# Patient Record
Sex: Female | Born: 1955
Health system: Southern US, Community
[De-identification: ages and names within clinical notes are randomized; demographics above are authoritative.]

## PROBLEM LIST (undated history)

## (undated) DIAGNOSIS — R200 Anesthesia of skin: Secondary | ICD-10-CM

## (undated) DIAGNOSIS — R29810 Facial weakness: Secondary | ICD-10-CM

## (undated) DIAGNOSIS — G2581 Restless legs syndrome: Secondary | ICD-10-CM

## (undated) DIAGNOSIS — G4733 Obstructive sleep apnea (adult) (pediatric): Secondary | ICD-10-CM

## (undated) DIAGNOSIS — Z9989 Dependence on other enabling machines and devices: Secondary | ICD-10-CM

## (undated) DIAGNOSIS — F419 Anxiety disorder, unspecified: Secondary | ICD-10-CM

## (undated) HISTORY — DX: Obstructive sleep apnea (adult) (pediatric): G47.33

## (undated) HISTORY — DX: Restless legs syndrome: G25.81

## (undated) HISTORY — DX: Facial weakness: R29.810

## (undated) HISTORY — DX: Anesthesia of skin: R20.0

## (undated) HISTORY — DX: Anxiety disorder, unspecified: F41.9

## (undated) HISTORY — DX: Dependence on other enabling machines and devices: Z99.89

---

## 2006-09-21 ENCOUNTER — Other Ambulatory Visit: Admission: RE | Admit: 2006-09-21 | Discharge: 2006-09-21 | Payer: Self-pay | Admitting: Family Medicine

## 2008-04-26 ENCOUNTER — Encounter: Admission: RE | Admit: 2008-04-26 | Discharge: 2008-04-26 | Payer: Self-pay | Admitting: Family Medicine

## 2010-08-30 ENCOUNTER — Encounter: Admission: RE | Admit: 2010-08-30 | Discharge: 2010-08-30 | Payer: Self-pay | Admitting: *Deleted

## 2011-07-30 ENCOUNTER — Other Ambulatory Visit: Payer: Self-pay | Admitting: *Deleted

## 2011-07-30 ENCOUNTER — Other Ambulatory Visit: Payer: Self-pay | Admitting: Family Medicine

## 2011-07-30 DIAGNOSIS — Z1231 Encounter for screening mammogram for malignant neoplasm of breast: Secondary | ICD-10-CM

## 2011-09-01 ENCOUNTER — Ambulatory Visit
Admission: RE | Admit: 2011-09-01 | Discharge: 2011-09-01 | Disposition: A | Discharge status: Home / Self Care | Payer: Self-pay | Source: Ambulatory Visit | Attending: *Deleted | Admitting: *Deleted

## 2011-09-01 DIAGNOSIS — Z1231 Encounter for screening mammogram for malignant neoplasm of breast: Secondary | ICD-10-CM

## 2011-09-02 ENCOUNTER — Other Ambulatory Visit (HOSPITAL_COMMUNITY)
Admission: RE | Admit: 2011-09-02 | Discharge: 2011-09-02 | Disposition: A | Discharge status: Home / Self Care | Payer: BC Managed Care – PPO | Source: Ambulatory Visit | Attending: Family Medicine | Admitting: Family Medicine

## 2011-09-02 ENCOUNTER — Other Ambulatory Visit: Payer: Self-pay

## 2011-09-02 ENCOUNTER — Other Ambulatory Visit: Payer: Self-pay | Admitting: Family Medicine

## 2011-09-02 DIAGNOSIS — Z01419 Encounter for gynecological examination (general) (routine) without abnormal findings: Secondary | ICD-10-CM | POA: Insufficient documentation

## 2011-09-02 DIAGNOSIS — N644 Mastodynia: Secondary | ICD-10-CM

## 2011-09-17 ENCOUNTER — Ambulatory Visit
Admission: RE | Admit: 2011-09-17 | Discharge: 2011-09-17 | Disposition: A | Discharge status: Home / Self Care | Payer: BC Managed Care – PPO | Source: Ambulatory Visit | Attending: Family Medicine | Admitting: Family Medicine

## 2011-09-17 ENCOUNTER — Other Ambulatory Visit: Payer: Self-pay | Admitting: Family Medicine

## 2011-09-17 DIAGNOSIS — N644 Mastodynia: Secondary | ICD-10-CM

## 2012-09-01 ENCOUNTER — Other Ambulatory Visit: Payer: Self-pay | Admitting: Family Medicine

## 2012-09-01 ENCOUNTER — Other Ambulatory Visit (HOSPITAL_COMMUNITY)
Admission: RE | Admit: 2012-09-01 | Discharge: 2012-09-01 | Disposition: A | Discharge status: Home / Self Care | Payer: BC Managed Care – PPO | Source: Ambulatory Visit | Attending: Family Medicine | Admitting: Family Medicine

## 2012-09-01 DIAGNOSIS — Z0000 Encounter for general adult medical examination without abnormal findings: Secondary | ICD-10-CM | POA: Insufficient documentation

## 2012-10-14 ENCOUNTER — Other Ambulatory Visit (HOSPITAL_COMMUNITY): Payer: BC Managed Care – PPO

## 2012-11-16 ENCOUNTER — Encounter: Payer: Self-pay | Admitting: Diagnostic Neuroimaging

## 2012-11-16 DIAGNOSIS — G459 Transient cerebral ischemic attack, unspecified: Secondary | ICD-10-CM

## 2012-12-16 ENCOUNTER — Other Ambulatory Visit: Payer: Self-pay | Admitting: Diagnostic Neuroimaging

## 2012-12-16 DIAGNOSIS — R209 Unspecified disturbances of skin sensation: Secondary | ICD-10-CM

## 2012-12-16 DIAGNOSIS — G459 Transient cerebral ischemic attack, unspecified: Secondary | ICD-10-CM

## 2013-09-15 ENCOUNTER — Other Ambulatory Visit: Payer: Self-pay | Admitting: Family Medicine

## 2013-09-15 DIAGNOSIS — Z1231 Encounter for screening mammogram for malignant neoplasm of breast: Secondary | ICD-10-CM

## 2013-10-27 ENCOUNTER — Ambulatory Visit
Admission: RE | Admit: 2013-10-27 | Discharge: 2013-10-27 | Disposition: A | Discharge status: Home / Self Care | Payer: BC Managed Care – PPO | Source: Ambulatory Visit | Attending: Family Medicine | Admitting: Family Medicine

## 2013-10-27 DIAGNOSIS — Z1231 Encounter for screening mammogram for malignant neoplasm of breast: Secondary | ICD-10-CM

## 2014-09-25 ENCOUNTER — Other Ambulatory Visit: Payer: Self-pay | Admitting: Family Medicine

## 2014-09-25 DIAGNOSIS — Z1231 Encounter for screening mammogram for malignant neoplasm of breast: Secondary | ICD-10-CM

## 2015-09-25 ENCOUNTER — Other Ambulatory Visit: Payer: Self-pay | Admitting: Family Medicine

## 2015-09-25 ENCOUNTER — Other Ambulatory Visit (HOSPITAL_COMMUNITY)
Admission: RE | Admit: 2015-09-25 | Discharge: 2015-09-25 | Disposition: A | Discharge status: Home / Self Care | Payer: BLUE CROSS/BLUE SHIELD | Source: Ambulatory Visit | Attending: Family Medicine | Admitting: Family Medicine

## 2015-09-25 DIAGNOSIS — Z01419 Encounter for gynecological examination (general) (routine) without abnormal findings: Secondary | ICD-10-CM | POA: Insufficient documentation

## 2015-09-26 LAB — CYTOLOGY - PAP

## 2016-06-02 DIAGNOSIS — N390 Urinary tract infection, site not specified: Secondary | ICD-10-CM | POA: Diagnosis not present

## 2016-06-02 DIAGNOSIS — R300 Dysuria: Secondary | ICD-10-CM | POA: Diagnosis not present

## 2016-06-02 DIAGNOSIS — M545 Low back pain: Secondary | ICD-10-CM | POA: Diagnosis not present

## 2016-09-26 DIAGNOSIS — E784 Other hyperlipidemia: Secondary | ICD-10-CM | POA: Diagnosis not present

## 2016-09-26 DIAGNOSIS — R7301 Impaired fasting glucose: Secondary | ICD-10-CM | POA: Diagnosis not present

## 2016-09-26 DIAGNOSIS — Z23 Encounter for immunization: Secondary | ICD-10-CM | POA: Diagnosis not present

## 2016-09-26 DIAGNOSIS — Z0001 Encounter for general adult medical examination with abnormal findings: Secondary | ICD-10-CM | POA: Diagnosis not present

## 2016-10-24 DIAGNOSIS — R300 Dysuria: Secondary | ICD-10-CM | POA: Diagnosis not present

## 2016-11-18 DIAGNOSIS — N390 Urinary tract infection, site not specified: Secondary | ICD-10-CM | POA: Diagnosis not present

## 2017-06-16 DIAGNOSIS — Z23 Encounter for immunization: Secondary | ICD-10-CM | POA: Diagnosis not present

## 2017-06-16 DIAGNOSIS — R7303 Prediabetes: Secondary | ICD-10-CM | POA: Diagnosis not present

## 2017-06-16 DIAGNOSIS — G4730 Sleep apnea, unspecified: Secondary | ICD-10-CM | POA: Diagnosis not present

## 2017-06-16 DIAGNOSIS — E784 Other hyperlipidemia: Secondary | ICD-10-CM | POA: Diagnosis not present

## 2017-06-16 DIAGNOSIS — R5383 Other fatigue: Secondary | ICD-10-CM | POA: Diagnosis not present

## 2018-04-29 ENCOUNTER — Other Ambulatory Visit (HOSPITAL_COMMUNITY)
Admission: RE | Admit: 2018-04-29 | Discharge: 2018-04-29 | Disposition: A | Discharge status: Home / Self Care | Payer: BLUE CROSS/BLUE SHIELD | Source: Ambulatory Visit | Attending: Family Medicine | Admitting: Family Medicine

## 2018-04-29 ENCOUNTER — Other Ambulatory Visit: Payer: Self-pay | Admitting: Family Medicine

## 2018-04-29 DIAGNOSIS — E785 Hyperlipidemia, unspecified: Secondary | ICD-10-CM | POA: Diagnosis not present

## 2018-04-29 DIAGNOSIS — R7303 Prediabetes: Secondary | ICD-10-CM | POA: Diagnosis not present

## 2018-04-29 DIAGNOSIS — Z01411 Encounter for gynecological examination (general) (routine) with abnormal findings: Secondary | ICD-10-CM | POA: Diagnosis not present

## 2018-04-29 DIAGNOSIS — Z0000 Encounter for general adult medical examination without abnormal findings: Secondary | ICD-10-CM | POA: Diagnosis not present

## 2018-04-29 DIAGNOSIS — Z124 Encounter for screening for malignant neoplasm of cervix: Secondary | ICD-10-CM | POA: Diagnosis not present

## 2018-04-30 LAB — CYTOLOGY - PAP
Diagnosis: NEGATIVE
HPV: NOT DETECTED

## 2018-05-03 DIAGNOSIS — Z1231 Encounter for screening mammogram for malignant neoplasm of breast: Secondary | ICD-10-CM | POA: Diagnosis not present

## 2018-08-17 DIAGNOSIS — G4700 Insomnia, unspecified: Secondary | ICD-10-CM | POA: Diagnosis not present

## 2018-08-17 DIAGNOSIS — J209 Acute bronchitis, unspecified: Secondary | ICD-10-CM | POA: Diagnosis not present

## 2018-09-04 DIAGNOSIS — R030 Elevated blood-pressure reading, without diagnosis of hypertension: Secondary | ICD-10-CM | POA: Diagnosis not present

## 2018-09-04 DIAGNOSIS — J209 Acute bronchitis, unspecified: Secondary | ICD-10-CM | POA: Diagnosis not present

## 2018-09-04 DIAGNOSIS — N390 Urinary tract infection, site not specified: Secondary | ICD-10-CM | POA: Diagnosis not present

## 2018-09-11 DIAGNOSIS — J209 Acute bronchitis, unspecified: Secondary | ICD-10-CM | POA: Diagnosis not present

## 2018-09-11 DIAGNOSIS — G4730 Sleep apnea, unspecified: Secondary | ICD-10-CM | POA: Diagnosis not present

## 2018-09-13 DIAGNOSIS — G4730 Sleep apnea, unspecified: Secondary | ICD-10-CM | POA: Diagnosis not present

## 2018-09-13 DIAGNOSIS — J45901 Unspecified asthma with (acute) exacerbation: Secondary | ICD-10-CM | POA: Diagnosis not present

## 2018-09-13 DIAGNOSIS — G4700 Insomnia, unspecified: Secondary | ICD-10-CM | POA: Diagnosis not present

## 2018-11-26 DIAGNOSIS — J069 Acute upper respiratory infection, unspecified: Secondary | ICD-10-CM | POA: Diagnosis not present

## 2018-12-06 DIAGNOSIS — G4733 Obstructive sleep apnea (adult) (pediatric): Secondary | ICD-10-CM | POA: Diagnosis not present

## 2018-12-23 ENCOUNTER — Ambulatory Visit
Admission: RE | Admit: 2018-12-23 | Discharge: 2018-12-23 | Disposition: A | Discharge status: Home / Self Care | Payer: BLUE CROSS/BLUE SHIELD | Source: Ambulatory Visit | Attending: Family Medicine | Admitting: Family Medicine

## 2018-12-23 ENCOUNTER — Other Ambulatory Visit: Payer: Self-pay | Admitting: Family Medicine

## 2018-12-23 ENCOUNTER — Other Ambulatory Visit: Payer: Self-pay

## 2018-12-23 DIAGNOSIS — R05 Cough: Secondary | ICD-10-CM | POA: Diagnosis not present

## 2018-12-23 DIAGNOSIS — R059 Cough, unspecified: Secondary | ICD-10-CM

## 2019-02-03 DIAGNOSIS — G4733 Obstructive sleep apnea (adult) (pediatric): Secondary | ICD-10-CM | POA: Diagnosis not present

## 2019-02-11 DIAGNOSIS — J988 Other specified respiratory disorders: Secondary | ICD-10-CM | POA: Diagnosis not present

## 2019-02-18 DIAGNOSIS — R05 Cough: Secondary | ICD-10-CM | POA: Diagnosis not present

## 2019-02-22 ENCOUNTER — Other Ambulatory Visit: Payer: Self-pay | Admitting: Family Medicine

## 2019-02-22 ENCOUNTER — Ambulatory Visit
Admission: RE | Admit: 2019-02-22 | Discharge: 2019-02-22 | Disposition: A | Discharge status: Home / Self Care | Payer: BLUE CROSS/BLUE SHIELD | Source: Ambulatory Visit | Attending: Family Medicine | Admitting: Family Medicine

## 2019-02-22 DIAGNOSIS — R059 Cough, unspecified: Secondary | ICD-10-CM

## 2019-02-22 DIAGNOSIS — R05 Cough: Secondary | ICD-10-CM

## 2019-05-04 DIAGNOSIS — Z0000 Encounter for general adult medical examination without abnormal findings: Secondary | ICD-10-CM | POA: Diagnosis not present

## 2019-05-04 DIAGNOSIS — R300 Dysuria: Secondary | ICD-10-CM | POA: Diagnosis not present

## 2019-05-04 DIAGNOSIS — R5383 Other fatigue: Secondary | ICD-10-CM | POA: Diagnosis not present

## 2019-05-04 DIAGNOSIS — Z8249 Family history of ischemic heart disease and other diseases of the circulatory system: Secondary | ICD-10-CM | POA: Diagnosis not present

## 2019-05-04 DIAGNOSIS — R7303 Prediabetes: Secondary | ICD-10-CM | POA: Diagnosis not present

## 2019-05-04 DIAGNOSIS — E785 Hyperlipidemia, unspecified: Secondary | ICD-10-CM | POA: Diagnosis not present

## 2019-06-10 DIAGNOSIS — K219 Gastro-esophageal reflux disease without esophagitis: Secondary | ICD-10-CM | POA: Diagnosis not present

## 2019-08-17 DIAGNOSIS — Z1159 Encounter for screening for other viral diseases: Secondary | ICD-10-CM | POA: Diagnosis not present

## 2019-08-22 DIAGNOSIS — K2100 Gastro-esophageal reflux disease with esophagitis, without bleeding: Secondary | ICD-10-CM | POA: Diagnosis not present

## 2019-08-22 DIAGNOSIS — Z1211 Encounter for screening for malignant neoplasm of colon: Secondary | ICD-10-CM | POA: Diagnosis not present

## 2019-08-22 DIAGNOSIS — K224 Dyskinesia of esophagus: Secondary | ICD-10-CM | POA: Diagnosis not present

## 2019-08-22 DIAGNOSIS — D120 Benign neoplasm of cecum: Secondary | ICD-10-CM | POA: Diagnosis not present

## 2019-08-22 DIAGNOSIS — K2970 Gastritis, unspecified, without bleeding: Secondary | ICD-10-CM | POA: Diagnosis not present

## 2019-08-22 DIAGNOSIS — K219 Gastro-esophageal reflux disease without esophagitis: Secondary | ICD-10-CM | POA: Diagnosis not present

## 2019-08-22 DIAGNOSIS — R12 Heartburn: Secondary | ICD-10-CM | POA: Diagnosis not present

## 2019-08-22 DIAGNOSIS — K2940 Chronic atrophic gastritis without bleeding: Secondary | ICD-10-CM | POA: Diagnosis not present

## 2019-11-21 IMAGING — DX CHEST - 2 VIEW
2 series · 2 of 2 positions shown · non-contrast
Comparison: No prior.

CLINICAL DATA: Productive cough.

EXAM:
CHEST - 2 VIEW

[dg chest 2 view (1 of 2)]
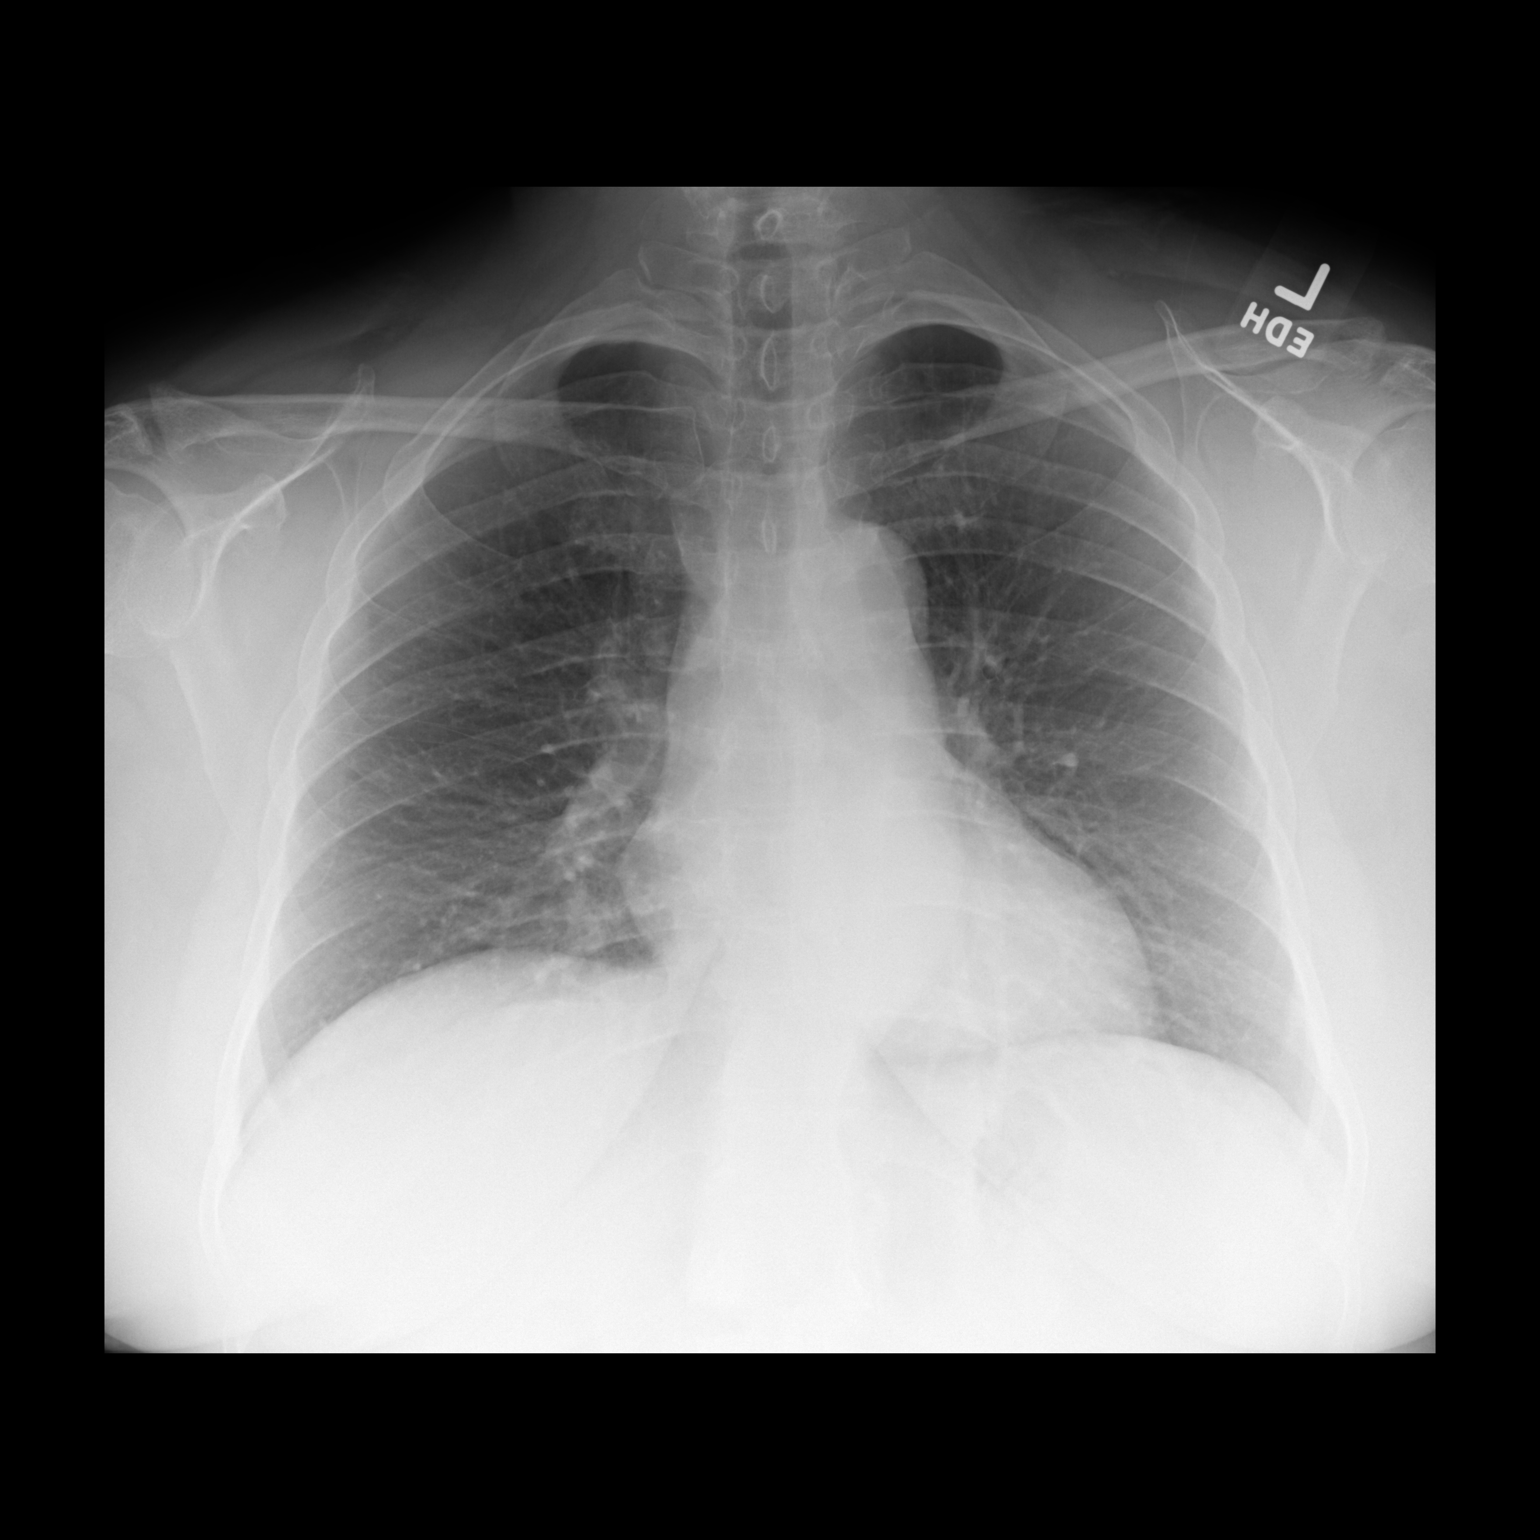

[dg chest 2 view (2 of 2)]
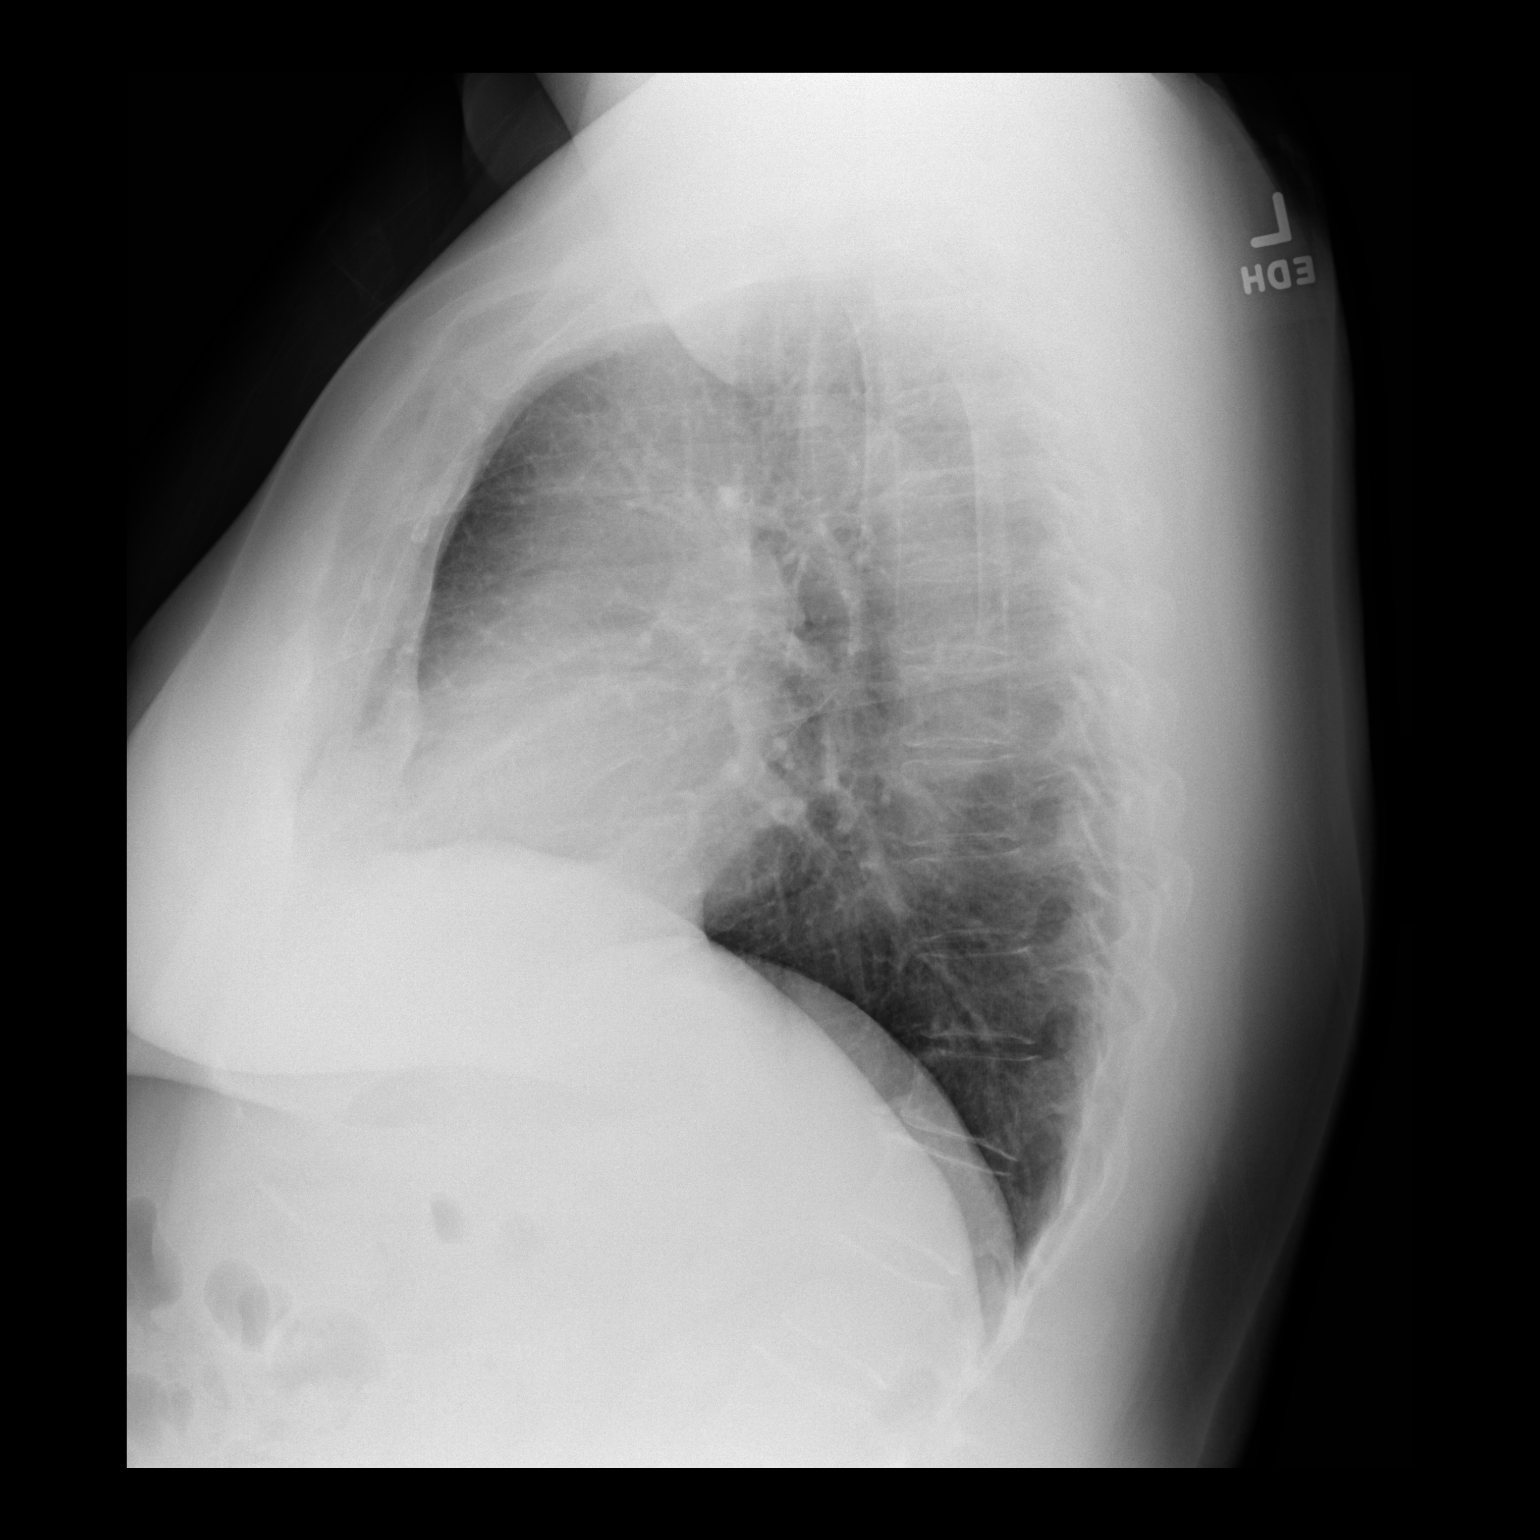

[2 of 2 positions shown; findings below may reference images not displayed]

FINDINGS: Mediastinum hilar structures normal. Cardiomegaly with normal
pulmonary vascularity. Mild right base infiltrate. No pleural
effusion or pneumothorax.
IMPRESSION: 1.  Mild right base infiltrate.

2.  Mild cardiomegaly.  No pulmonary venous congestion.

## 2020-06-27 ENCOUNTER — Other Ambulatory Visit: Payer: Self-pay | Admitting: Orthopedic Surgery

## 2020-06-27 DIAGNOSIS — R2232 Localized swelling, mass and lump, left upper limb: Secondary | ICD-10-CM

## 2020-07-12 ENCOUNTER — Other Ambulatory Visit: Payer: Self-pay | Admitting: Family Medicine

## 2020-07-12 DIAGNOSIS — Z1231 Encounter for screening mammogram for malignant neoplasm of breast: Secondary | ICD-10-CM

## 2020-07-18 ENCOUNTER — Ambulatory Visit
Admission: RE | Admit: 2020-07-18 | Discharge: 2020-07-18 | Disposition: A | Discharge status: Home / Self Care | Payer: BLUE CROSS/BLUE SHIELD | Source: Ambulatory Visit | Attending: Orthopedic Surgery | Admitting: Orthopedic Surgery

## 2020-07-18 DIAGNOSIS — R2232 Localized swelling, mass and lump, left upper limb: Secondary | ICD-10-CM

## 2020-08-20 ENCOUNTER — Other Ambulatory Visit: Payer: Self-pay

## 2020-08-20 ENCOUNTER — Ambulatory Visit
Admission: RE | Admit: 2020-08-20 | Discharge: 2020-08-20 | Disposition: A | Discharge status: Home / Self Care | Payer: BLUE CROSS/BLUE SHIELD | Source: Ambulatory Visit | Attending: Family Medicine | Admitting: Family Medicine

## 2020-08-20 DIAGNOSIS — Z1231 Encounter for screening mammogram for malignant neoplasm of breast: Secondary | ICD-10-CM

## 2020-08-24 ENCOUNTER — Other Ambulatory Visit: Payer: Self-pay | Admitting: Family Medicine

## 2020-08-24 DIAGNOSIS — R928 Other abnormal and inconclusive findings on diagnostic imaging of breast: Secondary | ICD-10-CM

## 2020-09-10 ENCOUNTER — Ambulatory Visit
Admission: RE | Admit: 2020-09-10 | Discharge: 2020-09-10 | Disposition: A | Discharge status: Home / Self Care | Payer: Managed Care, Other (non HMO) | Source: Ambulatory Visit | Attending: Family Medicine | Admitting: Family Medicine

## 2020-09-10 ENCOUNTER — Other Ambulatory Visit: Payer: Self-pay

## 2020-09-10 ENCOUNTER — Ambulatory Visit: Payer: Managed Care, Other (non HMO)

## 2020-09-10 DIAGNOSIS — R928 Other abnormal and inconclusive findings on diagnostic imaging of breast: Secondary | ICD-10-CM

## 2021-12-24 DIAGNOSIS — R350 Frequency of micturition: Secondary | ICD-10-CM | POA: Diagnosis not present

## 2021-12-30 DIAGNOSIS — R310 Gross hematuria: Secondary | ICD-10-CM | POA: Diagnosis not present

## 2022-01-06 ENCOUNTER — Ambulatory Visit: Payer: BC Managed Care – PPO | Attending: Family Medicine

## 2022-01-06 DIAGNOSIS — R2681 Unsteadiness on feet: Secondary | ICD-10-CM

## 2022-01-06 DIAGNOSIS — R42 Dizziness and giddiness: Secondary | ICD-10-CM

## 2022-01-06 DIAGNOSIS — H8111 Benign paroxysmal vertigo, right ear: Secondary | ICD-10-CM | POA: Diagnosis not present

## 2022-01-06 NOTE — Therapy (Signed)
?OUTPATIENT PHYSICAL THERAPY VESTIBULAR EVALUATION ? ? ? ? ?Patient Name: Monique Romero ?MRN: [id number] ?DOB:April 01, 1956, 66 y.o., female ?Today's Date: 01/06/2022 ? ?PCP: London Pepper, MD ?REFERRING PROVIDER: Lujean Amel, MD ? ? PT End of Session - 01/06/22 0719   ? ? Visit Number 1   ? Number of Visits 5   ? Date for PT Re-Evaluation 02/07/22   ? Authorization Type BCBS   ? PT Start Time [phone]   ? PT Stop Time 0800   ? PT Time Calculation (min) 42 min   ? Activity Tolerance Patient tolerated treatment well   ? Behavior During Therapy [place] for tasks assessed/performed   ? ?  ?  ? ?  ? ? ?Past Medical History:  ?Diagnosis Date  ? Anxiety   ? Facial weakness   ? OSA (obstructive sleep apnea)   ? OSA on CPAP   ? Right facial numbness   ? RLS (restless legs syndrome)   ? ?History reviewed. No pertinent surgical history. ?Patient Active Problem List  ? Diagnosis Date Noted  ? TIA (transient ischemic attack) 11/16/2012  ? ? ?ONSET DATE:12/30/2021 (Referral Date)  ? ?REFERRING DIAG: R42 (ICD-10-CM) - Dizziness and giddiness ? ?THERAPY DIAG:  ?Dizziness and giddiness ? ?BPPV (benign paroxysmal positional vertigo), right ? ?Unsteadiness on feet ? ?SUBJECTIVE:  ? ?SUBJECTIVE STATEMENT: ?Patient reports that first noticed the dizziness approx 4 months. Reports it comes and goes. Patient reports when she lays down on her back and on the right side she has a spinning sensation. Patient reports that she has also had some nausea associated. Also has instances where she feels like she is going to faint. Patient denies hearing changes, husband feels like it has decreased. Also reports some tinnitus in the R ear > L ear. No falls due to the dizziness, but does report she feels unsteady. Especially reports some lightheadedness in the morning.  ?Pt accompanied by: significant other ? ?PERTINENT HISTORY: Anxiety, Facial Weaknesss, OSA, Restless Leg ? ? ?PAIN:  ?Are you having pain? No ? ?PRECAUTIONS: None ? ?WEIGHT BEARING RESTRICTIONS  No ? ?FALLS: Has patient fallen in last 6 months? No ? ?LIVING ENVIRONMENT: ?Lives with: lives with their family ?Lives in: House/apartment ?Has following equipment at home: None ? 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  ? ?PATIENT GOALS Improve the Dizziness ? ?OBJECTIVE:  ? ?COGNITION: ?Overall cognitive status: Within functional limits for tasks assessed ?  ?SENSATION: ?WFL ? ?POSTURE: No Significant postural limitations ? ?STRENGTH: WFL ? ?TRANSFERS: ?Assistive device utilized: None  ?Sit to stand: Complete Independence ?Stand to sit: Complete Independence ? ?GAIT: ?Gait pattern: WFL ?Distance walked: clinic distance ?Assistive device utilized: None ?Level of assistance: Complete Independence ?Comments: mild unsteadiness after ambulation ? ? ?PATIENT SURVEYS:  ?FOTO DPS: 55.3 and DFS: 55 ? ? ?VESTIBULAR ASSESSMENT ?  ? SYMPTOM BEHAVIOR: ?  Subjective history: see subjective ?  Non-Vestibular symptoms: tinnitus ?  Type of dizziness: Imbalance (Disequilibrium), Spinning/Vertigo, Unsteady with head/body turns, and Lightheadedness/Faint ?  Frequency: once in a while ?  Duration: seconds to minutes ?  Aggravating factors: Induced by position change: lying supine and rolling to the right and Worse in the morning ?  Relieving factors: closing eyes and rest ?  Progression of symptoms: better ? ? OCULOMOTOR EXAM: ?  Ocular Alignment: normal ?  Ocular ROM: No Limitations ?  Spontaneous Nystagmus: absent ?  Gaze-Induced Nystagmus: absent ?  Smooth Pursuits: intact ?  Saccades: intact ?  ? ?  VESTIBULAR - OCULAR REFLEX:  ?  Slow VOR: Normal ?  VOR Cancellation: Normal ?  Head-Impulse Test: HIT Right: positive ?HIT Left: negative ?  Dynamic Visual Acuity: Static: 11 ?Dynamic: 8 ?  ? POSITIONAL TESTING: Right Dix-Hallpike: upbeating, right nystagmus; Duration:20 seconds ?Left Dix-Hallpike: none; Duration: 0 ?Right Roll Test: none; Duration: 0 ?Left Roll Test: none; Duration: 0 ?  ? ?MOTION  SENSITIVITY: ? ?  Motion Sensitivity Quotient ? ?Intensity: 0 = none, 1 = Lightheaded, 2 = Mild, 3 = Moderate, 4 = Severe, 5 = Vomiting ? Intensity  ?1. Sitting to supine 0  ?2. Supine to L side 0  ?3. Supine to R side 0  ?4. Supine to sitting 2  ?5. L Hallpike-Dix 0  ?6. Up from L  1  ?7. R Hallpike-Dix 2  ?8. Up from R  2  ?9. Sitting, head  ?tipped to L knee   ?10. Head up from L  ?knee   ?11. Sitting, head  ?tipped to R knee   ?12. Head up from R  ?knee   ?13. Sitting head turns x5   ?14.Sitting head nods x5   ?15. In stance, 180?  ?turn to L    ?16. In stance, 180?  ?turn to R   ? ? ? ? ?VESTIBULAR TREATMENT: ? ?Canalith Repositioning: ?  Epley Right: Number of Reps: 1 and Response to Treatment: symptoms improved ? ? ?PATIENT EDUCATION: ?Education details: Educated on POC/evaluation Findings ?Person educated: Patient and Spouse ?Education method: Explanation ?Education comprehension: verbalized understanding ? ? ?GOALS: ?Goals reviewed with patient? Yes ? ?SHORT TERM GOALS = LONG TERM GOALS ? ? ?LONG TERM GOALS: Target date: 02/07/2022 ? ?Pt will be independent with initial HEP for improved vestibular/balance  ?Baseline: No HEP established ?Goal status: INITIAL ? ?2.  Patient will demonstrate (-) positional testing to indicate resolution of BPPV ?Baseline: R BPPV ?Goal status: INITIAL ? ?3.  Pt will improve DVA to </= 2 line difference indicating improved VOR ?Baseline: 3 line difference ?Goal status: INITIAL ? ?4.  Patient will verbalize understanding of self treatment for BPPV in case of future reoccurence ?Baseline: dependent ?Goal status: INITIAL ? ?ASSESSMENT: ? ?CLINICAL IMPRESSION: ?Patient is a 66 y.o. female referred to Neuro OPPT services for Dizziness/Vertigo. Patient's PMH significant for the following: Anxiety, Facial Weaknesss, OSA, Restless Leg . Upon evaluation, patient presents with the following impairments: dizziness, positive HIT on R, R Upbeating Nystagmus of short duration with R Dix  Hallpike indicating R Posterior Canal Canalithiasis. Completed CRM x 1 rep with improvements noted. Patient will benefit from skilled PT services to address impairments.  ? ? ? ?OBJECTIVE IMPAIRMENTS decreased activity tolerance, decreased balance, and dizziness.  ? ?ACTIVITY LIMITATIONS driving and occupation.  ? ?PERSONAL FACTORS Time since onset of injury/illness/exacerbation and 3+ comorbidities: Anxiety, Facial Weaknesss, OSA, Restless Leg  are also affecting patient's functional outcome.  ? ? ?REHAB POTENTIAL: Excellent ? ?CLINICAL DECISION MAKING: Stable/uncomplicated ? ?EVALUATION COMPLEXITY: Low ? ? ?PLAN: ?PT FREQUENCY: 1x/week ? ?PT DURATION: 4 weeks ? ?PLANNED INTERVENTIONS: Therapeutic exercises, Therapeutic activity, Neuromuscular re-education, Balance training, Gait training, Patient/Family education, Joint mobilization, Stair training, Vestibular training, and Canalith repositioning ? ?PLAN FOR NEXT SESSION: Reassess R BPPV and treat as indicated. Initiate VOR HEP. At D/C complete FOTO. ? ? ?Jones Bales, PT, DPT ?01/06/2022, 9:05 AM  ?

## 2022-01-13 ENCOUNTER — Ambulatory Visit: Payer: BC Managed Care – PPO | Admitting: Physical Therapy

## 2022-01-13 ENCOUNTER — Encounter: Payer: Self-pay | Admitting: Physical Therapy

## 2022-01-13 DIAGNOSIS — R2681 Unsteadiness on feet: Secondary | ICD-10-CM | POA: Diagnosis not present

## 2022-01-13 DIAGNOSIS — R42 Dizziness and giddiness: Secondary | ICD-10-CM | POA: Diagnosis not present

## 2022-01-13 DIAGNOSIS — H8111 Benign paroxysmal vertigo, right ear: Secondary | ICD-10-CM

## 2022-01-13 NOTE — Patient Instructions (Addendum)
Gaze Stabilization: Sitting ? ? ? ?Keeping eyes on target on a plain wall a few feet away, gently tilt head down 15-30? and move head side to side for __30__ seconds. Repeat while moving head up and down for __30__ seconds. ? ?Perform each 3 times.  ?Do _2___ sessions per day. ? ? ? ?Manteniendo los ojos en el objetivo en una pared lisa a unos pocos pies de distancia, incline suavemente la cabeza hacia abajo de 15 a 30? y mu?vala de lado a lado durante __30__ segundos. Repita mientras mueve la cabeza hacia arriba y [place] abajo durante __30__ segundos. ? ?Realice cada 3 veces. ?Haz _2___ sesiones por d?a. ?Copyright ? VHI. All rights reserved.  ? ?

## 2022-01-13 NOTE — Therapy (Addendum)
?OUTPATIENT PHYSICAL THERAPY VESTIBULAR TREATMENT NOTE ? ? ?Patient Name: Monique Romero ?MRN: [id number] ?DOB:Apr 10, 1956, 66 y.o., female ?Today's Date: 01/13/2022 ? ?PCP: Farris Has, MD ?REFERRING PROVIDER: Farris Has, MD ? ? PT End of Session - 01/13/22 0850   ? ? Visit Number 2   ? Number of Visits 5   ? Date for PT Re-Evaluation 02/07/22   ? Authorization Type BCBS   ? PT Start Time 0848   ? PT Stop Time 0926   ? PT Time Calculation (min) 38 min   ? Activity Tolerance Patient tolerated treatment well   ? Behavior During Therapy [facility] for tasks assessed/performed   ? ?  ?  ? ?  ? ? ?Past Medical History:  ?Diagnosis Date  ? Anxiety   ? Facial weakness   ? OSA (obstructive sleep apnea)   ? OSA on CPAP   ? Right facial numbness   ? RLS (restless legs syndrome)   ? ?History reviewed. No pertinent surgical history. ?Patient Active Problem List  ? Diagnosis Date Noted  ? TIA (transient ischemic attack) 11/16/2012  ? ? ?ONSET DATE: 12/30/2021 (Referral Date)  ? ?REFERRING DIAG: R42 (ICD-10-CM) - Dizziness and giddiness ? ?THERAPY DIAG:  ?Dizziness and giddiness ? ?BPPV (benign paroxysmal positional vertigo), right ? ?Unsteadiness on feet ? ?PERTINENT HISTORY: Anxiety, Facial Weaknesss, OSA, Restless Leg ? ?PRECAUTIONS: None ? ?SUBJECTIVE: Still having the dizziness, sometimes when looking up.  ? ?Pt's husband helps with spanish translation.  ? ?PAIN:  ?Are you having pain? No ? ? ? ?OBJECTIVE:  ? ?POSITIONAL TESTS: ? ?Right Dix-Hallpike: upbeating, right nystagmus; Duration:10-15 seconds   ? ?Re-assessed after 1 rep of Epley, pt with no dizziness and no nystagmus.  ? ?VESTIBULAR TREATMENT: ? ?Canalith Repositioning: ?Epley Right: Number of Reps: 1, Response to Treatment: symptoms improved. ?   ?Gaze Adaptation: ?x1 Viewing Horizontal: Position: Seated with feet on floor, Time: 30 sec, Reps: 4, and Comment: Cues for gentle chin tuck and proper ROM, mild dizziness  ? ? ?x1 Viewing Vertical:  Position: Seated with feet  on floor, Time: 30 sec , Reps: 2, and Comment: no dizziness  ? ? ? ? ?PATIENT EDUCATION: ?Education details: Educated on findings of R Gilberto Better and resolution of R BPPV after 1 rep of Epley maneuver, educated to have pt try to sleep on her L side for a couple nights, Initiated VOR HEP and purpose of performing these exercises in regards to evaluation findings.  ?Person educated: Patient and Spouse ?Education method: Explanation ?Education comprehension: verbalized understanding ? ? ?HEP: ?Seated VOR x1 (see pt instructions) ?  ?  ?GOALS: ?Goals reviewed with patient? Yes ?  ?SHORT TERM GOALS = LONG TERM GOALS ?  ?  ?LONG TERM GOALS: Target date: 02/07/2022 ?  ?Pt will be independent with initial HEP for improved vestibular/balance  ?Baseline: No HEP established ?Goal status: INITIAL ?  ?2.  Patient will demonstrate (-) positional testing to indicate resolution of BPPV ?Baseline: R BPPV ?Goal status: INITIAL ?  ?3.  Pt will improve DVA to </= 2 line difference indicating improved VOR ?Baseline: 3 line difference ?Goal status: INITIAL ?  ?4.  Patient will verbalize understanding of self treatment for BPPV in case of future reoccurence ?Baseline: dependent ?Goal status: INITIAL ?  ?ASSESSMENT: ?  ?CLINICAL IMPRESSION: ?Pt demonstrated continued  R Posterior Canal Canalithiasis with assessment with R [employer]. Treated with Epley x1 rep with pt reporting improved symptoms. When re-assessing, pt with no dizziness and no  nystagmus in R [employer]- indicating resolution of R posterior canal BPPV. Remainder of session focused on initiating HEP for VOR x1 in sitting. Pt with mild dizziness with horizontal head movements, but was able to tolerate well.  Will continue per POC.  ? ?  ?  ?  ?OBJECTIVE IMPAIRMENTS decreased activity tolerance, decreased balance, and dizziness.  ?  ?ACTIVITY LIMITATIONS driving and occupation.  ?  ?PERSONAL FACTORS Time since onset of injury/illness/exacerbation and 3+ comorbidities:  Anxiety, Facial Weaknesss, OSA, Restless Leg  are also affecting patient's functional outcome.  ?  ?  ?REHAB POTENTIAL: Excellent ?  ?CLINICAL DECISION MAKING: Stable/uncomplicated ?  ?EVALUATION COMPLEXITY: Low ?  ?  ?PLAN: ?PT FREQUENCY: 1x/week ?  ?PT DURATION: 4 weeks ?  ?PLANNED INTERVENTIONS: Therapeutic exercises, Therapeutic activity, Neuromuscular re-education, Balance training, Gait training, Patient/Family education, Joint mobilization, Stair training, Vestibular training, and Canalith repositioning ?  ?PLAN FOR NEXT SESSION: Reassess R BPPV and treat as indicated. Pt demonstrated resolution during today's session, but just in case. Educate on self Epley maneuver to perform at home if it does return. Review VOR HEP and progress as appropriate. At D/C complete FOTO. ?  ? ? ?Drake Leach, PT, DPT  ?01/13/2022, 9:31 AM ? ? ?   ? ?

## 2022-01-20 ENCOUNTER — Encounter: Payer: Self-pay | Admitting: Physical Therapy

## 2022-01-20 ENCOUNTER — Ambulatory Visit: Payer: BC Managed Care – PPO | Admitting: Physical Therapy

## 2022-01-20 DIAGNOSIS — R2681 Unsteadiness on feet: Secondary | ICD-10-CM | POA: Diagnosis not present

## 2022-01-20 DIAGNOSIS — R42 Dizziness and giddiness: Secondary | ICD-10-CM

## 2022-01-20 DIAGNOSIS — H8111 Benign paroxysmal vertigo, right ear: Secondary | ICD-10-CM

## 2022-01-20 NOTE — Patient Instructions (Signed)
?V?rtigo posicional benigno ?Benign Positional Vertigo ?El v?rtigo es la sensaci?n de que usted o todo lo que lo rodea se mueve cuando en realidad eso no sucede. El v?rtigo posicional benigno es el tipo de v?rtigo m?s com?n. Generalmente se trata de una enfermedad no da?ina (benigna). Esta afecci?n es posicional. Esto significa que los s?ntomas son desencadenados por ciertos movimientos y posiciones. ?Esta afecci?n puede ser peligrosa si ocurre mientras est? haciendo algo que podr?a suponer un riesgo para usted o para los dem?s. Incluye actividades como conducir u operar [place]. ??Cu?les son las causas? ?El o?do interno tiene canales llenos de l?quido que ayudan a que el cerebro perciba el movimiento y el equilibrio. Cuando el l?quido se mueve, el cerebro recibe mensajes sobre la posici?n del cuerpo. ?En el v?rtigo posicional benigno, los cristales de calcio que est?n en el o?do interno se desprenden y alteran la zona del o?do interno. Esto hace que el cerebro reciba mensajes confusos sobre la posici?n del cuerpo. ??Qu? incrementa el riesgo? ?Es m?s probable que sufra esta afecci?n si: ?Es mujer. ?Es mayor de 50 a?os. ?Ha sufrido una lesi?n en la cabeza recientemente. ?Tiene una enfermedad en el o?do interno. ??Cu?les son los signos o s?ntomas? ?Generalmente, los s?ntomas de este trastorno se presentan al mover la cabeza o los ojos en diferentes direcciones. Los s?ntomas pueden aparecer repentinamente y suelen durar menos de un minuto. Incluyen los siguientes: ?P?rdida del equilibrio y ca?das. ?Sensaci?n de estar dando vueltas o movi?ndose. ?Sensaci?n de que el entorno est? dando vueltas o movi?ndose. ?N?useas y v?mitos. ?Visi?n borrosa. ?Mareos. ?Movimientos oculares involuntarios (nistagmo). ?Los s?ntomas pueden ser leves y solo causar problemas menores, o pueden ser graves e interferir en la vida cotidiana. Los episodios de v?rtigo posicional benigno pueden repetirse (volver a Arts administrator) con el transcurso del  tiempo. Los s?ntomas tambi?n pueden mejorar con [employer]. ??C?mo se diagnostica? ?Esta afecci?n se puede diagnosticar en funci?n de lo siguiente: ?Sus antecedentes m?dicos. ?Un examen f?sico de la cabeza, el cuello y los o?dos. ?Pruebas de posici?n para detectar o estimular el v?rtigo. Es posible que le pidan que gire la cabeza y [place] de posici?n, como pasar de estar sentado a Acupuncturist. El m?dico estar? pendiente por si aparecen s?ntomas de v?rtigo. ?Tal vez lo deriven a un m?dico especialista en problemas de la garganta, la nariz y el o?do (otorrinolaring?logo), o a uno que se especializa en trastornos del sistema nervioso (neur?logo). ??C?mo se trata? ? ?Esta afecci?n se puede tratar en una sesi?n en la cual el m?dico le pondr? la cabeza en posiciones espec?ficas para ayudar a que los cristales desplazados del o?do interno se muevan. El tratamiento de esta afecci?n puede llevar varias sesiones. Cuando los casos son graves, tal vez haya que realizar una cirug?a, pero esto no es frecuente. ?En algunos casos, el v?rtigo posicional benigno se resuelve por s? solo en el t?rmino de 2 o 4 semanas. ?Siga estas instrucciones en su casa: ?Seguridad ?Mu?vase lentamente. Evite algunas posiciones o determinados movimientos repentinos de la cabeza y el cuerpo como se lo haya indicado el m?dico. ?Evite conducir y Secretary/administrator que el m?dico le indique que es seguro [place]. ?No haga ninguna tarea que podr?a ser peligrosa para usted o para Producer, television/film/video en caso de v?rtigo. ?Si tiene dificultad para caminar o mantener el equilibrio, use un bast?n para Consulting civil engineer estabilidad. Si se siente mareado o inestable, si?ntese de inmediato. ?Retome sus actividades normales seg?n lo indicado por el m?dico. Preg?ntele al m?dico qu? actividades son  seguras para usted. ?Instrucciones generales ?Use los medicamentos de venta libre y los recetados solamente como se lo haya indicado el m?dico. ?Beba suficiente l?quido como para  mantener la orina de color [place] p?lido. ?Concurra a todas las visitas de seguimiento. Esto es importante. ?Comun?quese con un m?dico si: ?Tiene fiebre. ?Su afecci?n empeora o presenta s?ntomas nuevos. ?Sus familiares o amigos advierten cambios en su comportamiento. ?Tiene n?useas o v?mitos que empeoran. ?Tiene adormecimiento o sensaci?n de pinchazos y hormigueo. ?Busque ayuda de inmediato si: ?Tiene dificultad para hablar o para moverse. ?Siempre est? mareado o se desmaya. ?Presenta dolores de cabeza intensos. ?Tiene debilidad en los brazos o las piernas. ?Presenta cambios en la audici?n o la visi?n. ?Presenta rigidez en el cuello. ?Presenta sensibilidad a la luz. ?Estos s?ntomas pueden representar un problema grave que constituye Radio broadcast assistant. No espere a ver si los s?ntomas desaparecen. Solicite atenci?n m?dica de inmediato. Comun?quese con el servicio de emergencias de su localidad (911 en los [place]). No conduzca por sus propios medios [employer]. ?Resumen ?El v?rtigo es la sensaci?n de que usted o todo lo que lo rodea se mueve cuando en realidad eso no sucede. El v?rtigo posicional benigno es el tipo de v?rtigo m?s com?n. ?La causa de esta afecci?n es el desplazamiento de los cristales de calcio del o?do interno. Esto produce una alteraci?n en una zona del o?do interno que ayuda al cerebro a percibir el movimiento y el equilibrio. ?Los s?ntomas incluyen p?rdida del equilibrio y ca?das, sensaci?n de que usted o su entorno se mueve, n?useas y v?mitos, y visi?n borrosa. ?Esta afecci?n se puede diagnosticar en funci?n de los s?ntomas, un examen f?sico y pruebas de posicionamiento. ?Siga las instrucciones de seguridad que le haya dado el m?dico y vaya a todas las visitas de seguimiento. Esto es importante. ?Esta informaci?n no tiene Theme park manager el consejo del m?dico. Aseg?rese de hacerle al m?dico cualquier pregunta que tenga. ?Document Revised: 09/17/2020 Document Reviewed:  09/17/2020 ?Elsevier Patient Education ? 2023 Elsevier Inc. ? ? ? ?C?mo realizar la [employer] de Epley ?How to Perform the Epley Maneuver ?La [employer] de Eple[employer] es un ejercicio que alivia los s?ntomas del v?rtigo. El v?rtigo es la sensaci?n de que usted o todo lo que lo rodea se mueve cuando en realidad eso no sucede. Cuando una persona siente v?rtigo, puede tener la sensaci?n de que la habitaci?n da vueltas y puede tener dificultad para caminar. La maniobra de Epley se [place] para un tipo de v?rtigo que es causado por un dep?sito de calcio en una parte del o?do interno. La maniobra implica poner la cabeza en distintas posiciones para ayudar a que el dep?sito salga de la zona. ?Puede Firefighter en su casa cuando tenga los s?ntomas de v?rtigo. Puede repetirla tras 24 horas si el v?rtigo no ha desaparecido. ?Aunque la [facility] de Energy manager lo [place] del v?rtigo durante algunas semanas, es posible que los s?ntomas vuelvan. Esta maniobra [employer] s?ntomas del v?rtigo, pero no los [place]. ??Cu?les son los riesgos? ?Si se realiza de forma correcta, la [place] de Epley se considera un procedimiento seguro. En ocasiones, puede provocar mareos o n?useas que desaparecen despu?s de un breve per?odo. Si tiene otros s?ntomas, como cambios en la visi?n, debilidad o entumecimiento, deje de Clinical cytogeneticist y llame al m?dico. ?Materiales necesarios: ?Una cama o una mesa. ?Viona [place]. ?C?mo hacer la [employer] [facility] Epley ? ?  ? ?Si?ntese en el borde de una cama o una mesa con la espalda recta  y las piernas extendidas o colgando sobre el borde de la cama o la mesa. ?Gire la cabeza a [employer] o?do o el lado afectado, como se lo haya indicado el m?dico. ?Recu?stese hacia atr?s r?pidamente con la cabeza girada hasta que se encuentre recostado sobre la espalda. La cabeza debe quedar colgando (posici?n de cabeza colgante). Es Medical laboratory scientific officer una almohada debajo de los hombros. ?Mantenga esta posici?n durante 30 segundos  como m?nimo. Si se siente mareado o tiene s?ntomas de v?rtigo, contin?e sosteniendo la posici?n hasta que los s?ntomas desaparezcan. ?Gire la cabeza en direcci?n opuesta hasta que el o?do no afectado est? orientad

## 2022-01-20 NOTE — Therapy (Signed)
?OUTPATIENT PHYSICAL THERAPY VESTIBULAR TREATMENT NOTE ? ? ?Patient Name: Monique Romero ?MRN: [id number] ?DOB:08/03/1956, 66 y.o., female ?Today's Date: 01/20/2022 ? ?PCP: Farris Has, MD ?REFERRING PROVIDER: Farris Has, MD ? ? PT End of Session - 01/20/22 2025   ? ? Visit Number 3   ? Number of Visits 5   ? Date for PT Re-Evaluation 02/07/22   ? Authorization Type BCBS   ? PT Start Time [phone]   ? PT Stop Time 0930   ? PT Time Calculation (min) 40 min   ? Activity Tolerance Patient tolerated treatment well   ? Behavior During Therapy [facility] for tasks assessed/performed   ? ?  ?  ? ?  ? ? ? ?Past Medical History:  ?Diagnosis Date  ? Anxiety   ? Facial weakness   ? OSA (obstructive sleep apnea)   ? OSA on CPAP   ? Right facial numbness   ? RLS (restless legs syndrome)   ? ?History reviewed. No pertinent surgical history. ?Patient Active Problem List  ? Diagnosis Date Noted  ? TIA (transient ischemic attack) 11/16/2012  ? ? ?ONSET DATE: 12/30/2021 (Referral Date)  ? ?REFERRING DIAG: R42 (ICD-10-CM) - Dizziness and giddiness ? ?THERAPY DIAG:  ?BPPV (benign paroxysmal positional vertigo), right ? ?Dizziness and giddiness ? ?PERTINENT HISTORY: Anxiety, Facial Weaknesss, OSA, Restless Leg ? ?PRECAUTIONS: None ? ?SUBJECTIVE: Pt reports she went to park yesterday afternoon and tried to video some deer with her phone and it caused dizziness ? ?Pt's husband helps with spanish translation.  ? ?PAIN:  ?Are you having pain? No ? ? ? ?OBJECTIVE:  ? ?POSITIONAL TESTS: ?Rt Dix-Hallpike test (+) with Rt rotary upbeating nystagmus approximately 5 sec duration ? ?Rt and Lt sidelying tests (-) with no nystagmus and no c/o vertigo  ? ? ?VESTIBULAR TREATMENT: ? ?Canalith Repositioning: ?Epley Right: Number of Reps: 3,  Response to Treatment: symptoms improved. ?   ?SVA line10;  DVA line 8 (husband assisted in translation for this test);  pt reported increased slightly increased dizziness upon completion of test but subsided within approx. 5  secs ? ? ? ? ? ? ? ?PATIENT EDUCATION: ?Education details: Educated pt and husband in how to perform Epley maneuver for self treatment prn with written instructions and pictures of maneuver given to patient (from [facility] - see patient instructions);  also progressed x1 viewing from seated position to standing position; also gave pt information on BPPV etiology (in [place])  ?Person educated: Patient and Spouse ?Education method: Explanation ?Education comprehension: verbalized understanding ? ? ?HEP: ?Standing VOR x1 ; Epley maneuver instructions - both written instructions provided in Spanish and picture of Epley maneuver ?  ?  ?GOALS: ?Goals reviewed with patient? Yes ?  ?SHORT TERM GOALS = LONG TERM GOALS ?  ?  ?LONG TERM GOALS: Target date: 02/07/2022 ?  ?Pt will be independent with initial HEP for improved vestibular/balance  ?Baseline: No HEP established ?Goal status: INITIAL ?  ?2.  Patient will demonstrate (-) positional testing to indicate resolution of BPPV ?Baseline: R BPPV ?Goal status: INITIAL ?  ?3.  Pt will improve DVA to </= 2 line difference indicating improved VOR ?Baseline: 3 line difference ?Goal status: INITIAL ?  ?4.  Patient will verbalize understanding of self treatment for BPPV in case of future reoccurence ?Baseline: dependent ?Goal status: INITIAL ?  ?ASSESSMENT: ?  ?CLINICAL IMPRESSION: ?Pt demonstrated continued  R Posterior Canal Canalithiasis with assessment with R [employer]. Treated with Epley x1 rep with pt  reporting improved symptoms. When re-assessing, pt with no dizziness and no nystagmus in R [employer]- indicating resolution of R posterior canal BPPV. Remainder of session focused on initiating HEP for VOR x1 in sitting. Pt with mild dizziness with horizontal head movements, but was able to tolerate well.  Will continue per POC.  ? ?  ?  ?  ?OBJECTIVE IMPAIRMENTS decreased activity tolerance, decreased balance, and dizziness.  ?  ?ACTIVITY LIMITATIONS driving and occupation.  ?   ?PERSONAL FACTORS Time since onset of injury/illness/exacerbation and 3+ comorbidities: Anxiety, Facial Weaknesss, OSA, Restless Leg  are also affecting patient's functional outcome.  ?  ?  ?REHAB POTENTIAL: Excellent ?  ?CLINICAL DECISION MAKING: Stable/uncomplicated ?  ?EVALUATION COMPLEXITY: Low ?  ?  ?PLAN: ?PT FREQUENCY: 1x/week ?  ?PT DURATION: 4 weeks ?  ?PLANNED INTERVENTIONS: Therapeutic exercises, Therapeutic activity, Neuromuscular re-education, Balance training, Gait training, Patient/Family education, Joint mobilization, Stair training, Vestibular training, and Canalith repositioning ?  ?PLAN FOR NEXT SESSION: Reassess R BPPV - check LTG's and D/C ?  ? ? ?[id number], BJYNW GNFAOZH, PT ?01/20/2022, 8:28 PM ? ? ?   ? ?

## 2022-01-27 ENCOUNTER — Ambulatory Visit: Payer: BC Managed Care – PPO | Admitting: Physical Therapy

## 2022-01-27 DIAGNOSIS — R42 Dizziness and giddiness: Secondary | ICD-10-CM | POA: Diagnosis not present

## 2022-01-27 DIAGNOSIS — H8111 Benign paroxysmal vertigo, right ear: Secondary | ICD-10-CM

## 2022-01-27 DIAGNOSIS — R2681 Unsteadiness on feet: Secondary | ICD-10-CM | POA: Diagnosis not present

## 2022-01-27 NOTE — Therapy (Signed)
?OUTPATIENT PHYSICAL THERAPY VESTIBULAR TREATMENT NOTE/DISCHARGE SUMMARY ? ? ?Patient Name: Monique Romero ?MRN: [id number] ?DOB:[birth year], 66 y.o., female ?Today's Date: 01/27/2022 ? ?PCP: London Pepper, MD ?REFERRING PROVIDER: Lujean Amel, MD ? ? PT End of Session - 01/27/22 0932   ? ? Visit Number 4   ? Number of Visits 5   ? Date for PT Re-Evaluation 02/07/22   ? Authorization Type BCBS   ? PT Start Time 0930   ? PT Stop Time 4098   full time not used due to D/C visit  ? PT Time Calculation (min) 26 min   ? Activity Tolerance Patient tolerated treatment well   ? Behavior During Therapy [facility] for tasks assessed/performed   ? ?  ?  ? ?  ? ? ? ?Past Medical History:  ?Diagnosis Date  ? Anxiety   ? Facial weakness   ? OSA (obstructive sleep apnea)   ? OSA on CPAP   ? Right facial numbness   ? RLS (restless legs syndrome)   ? ?No past surgical history on file. ?Patient Active Problem List  ? Diagnosis Date Noted  ? TIA (transient ischemic attack) 11/16/2012  ? ? ?ONSET DATE: 12/30/2021 (Referral Date)  ? ?REFERRING DIAG: R42 (ICD-10-CM) - Dizziness and giddiness ? ?THERAPY DIAG:  ?BPPV (benign paroxysmal positional vertigo), right ? ?Dizziness and giddiness ? ?Unsteadiness on feet ? ?PERTINENT HISTORY: Anxiety, Facial Weaknesss, OSA, Restless Leg ? ?PRECAUTIONS: None ? ?SUBJECTIVE: Still feeling a little dizzy when she lays down. Doing the exercises. When she gets out of the car, feels like the pressure from the wind makes her a little dizzy.  ? ?Pt's husband helps with spanish translation.  ? ?PAIN:  ?Are you having pain? No ? ? ? ?OBJECTIVE:  ? ?POSITIONAL TESTS: ?Rt Dix-Hallpike test (+) with slight Rt rotary upbeating nystagmus approximately 5 sec duration ? ?Rt and Lt sidelying tests (-) with no nystagmus. ? ? ?VESTIBULAR TREATMENT: ? ?Canalith Repositioning: ?Epley Right: Number of Reps: 1,  Response to Treatment: symptoms improved. ?   ? ?Nestor Lewandowsky; x3 reps each side, pt only with very mild dizziness lasting  <5 sec during first rep, but improved afterwards. Provided for home for habituation. ? ? ? ? ? ? ?PATIENT EDUCATION: ?Education details: Reviewed Epley maneuver given last session for pt to perform for self treatment at home. Also provided pt with Nestor Lewandowsky exercises for habituation for very mild residual dizziness with R sidelying. Educated that if BPPV returns in future, pt can always try the maneuver or get a new referral to return to PT.  ?Person educated: Patient and Spouse ?Education method: Explanation ?Education comprehension: verbalized understanding ? ? ?HEP: ?Standing VOR x1 ; Epley maneuver instructions - both written instructions provided in Spanish and picture of Epley maneuver ? ?Access Code: [email or web address] ?URL: [email or web address] ?Date: 01/27/2022 ?Prepared by: Janann August ? ?Exercises ?- Brandt-Daroff Vestibular Exercise  - 1-2 x daily - 5 x weekly - 4-5 sets- provided in [place] and [place]  ? ?PHYSICAL THERAPY DISCHARGE SUMMARY ? ?Visits from Start of Care: 4 ? ?Current functional level related to goals / functional outcomes: ?See LTGs. ?  ?Remaining deficits: ?Very mild residual dizziness with R sidelying. ?  ?Education / Equipment: ?HEP (VOR, Epley, Nestor Lewandowsky)  ? ?Patient agrees to discharge. Patient goals were met. Patient is being discharged due to meeting the stated rehab goals. And pt being pleased with current functional status.  ? ?  ?  ?GOALS: ?Goals reviewed with patient? Yes ?  ?  SHORT TERM GOALS = LONG TERM GOALS ?  ?  ?LONG TERM GOALS: Target date: 02/07/2022 ?  ?Pt will be independent with initial HEP for improved vestibular/balance  ?Baseline: No HEP established ?Goal status: MET ?  ?2.  Patient will demonstrate (-) positional testing to indicate resolution of BPPV ?Baseline: R BPPV ?Goal status: MET ?  ?3.  Pt will improve DVA to </= 2 line difference indicating improved VOR ?Baseline: 2 line difference on 01/20/22 ?Goal status: MET ?  ?4.  Patient will  verbalize understanding of self treatment for BPPV in case of future reoccurence ?Baseline: provided education with use of Epley.  ?Goal status: MET ?  ?ASSESSMENT: ?  ?CLINICAL IMPRESSION: ?Pt demonstrated very mild R upbeating rotary nystagmus with R Marye Round, treating with Epley maneuver x1 with no dizziness/no nystagmus. Reviewed Epley maneuver for home as needed. Also taught pt Nestor Lewandowsky as she had very mild dizziness (but no nystagmus) with R sidelying that subsided very quickly. Improved with incr reps. Provided as HEP. Pt is pleased with her progress and has met all of her LTGs and is in agreement to be discharged from PT.  ?  ?  ?  ?OBJECTIVE IMPAIRMENTS decreased activity tolerance, decreased balance, and dizziness.  ?  ?ACTIVITY LIMITATIONS driving and occupation.  ?  ?PERSONAL FACTORS Time since onset of injury/illness/exacerbation and 3+ comorbidities: Anxiety, Facial Weaknesss, OSA, Restless Leg  are also affecting patient's functional outcome.  ?  ?  ?REHAB POTENTIAL: Excellent ?  ?CLINICAL DECISION MAKING: Stable/uncomplicated ?  ?EVALUATION COMPLEXITY: Low ?  ?  ?PLAN: ?PT FREQUENCY: 1x/week ?  ?PT DURATION: 4 weeks ?  ?PLANNED INTERVENTIONS: Therapeutic exercises, Therapeutic activity, Neuromuscular re-education, Balance training, Gait training, Patient/Family education, Joint mobilization, Stair training, Vestibular training, and Canalith repositioning ?  ?PLAN FOR NEXT SESSION: D/C  ? ? ?Arliss Journey, PT, DPT  ?01/27/2022, 10:00 AM ? ? ?   ? ?

## 2022-02-03 ENCOUNTER — Encounter: Payer: BC Managed Care – PPO | Admitting: Physical Therapy

## 2022-02-24 IMAGING — MG MM DIGITAL DIAGNOSTIC UNILAT*R* W/ TOMO W/ CAD
4 series · 4 of 12 positions shown · non-contrast
Comparison: Previous exam(s).

CLINICAL DATA: Screening recall for 2 asymmetries in the right
breast.

EXAM:
DIGITAL DIAGNOSTIC UNILATERAL RIGHT MAMMOGRAM WITH TOMO AND CAD

[R MLO synth-2D]
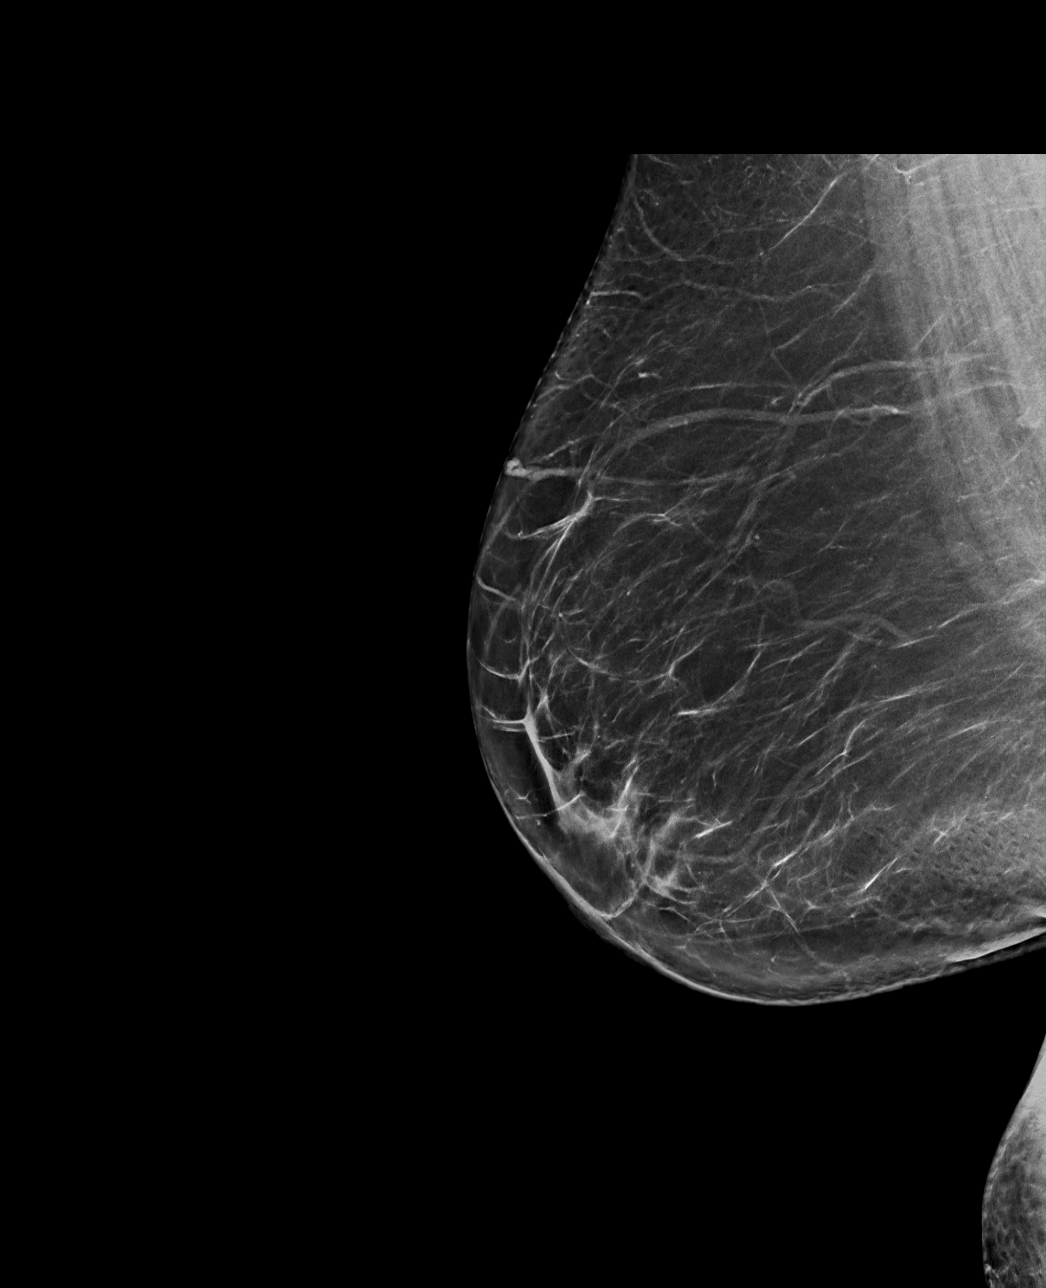

[R CC synth-2D]
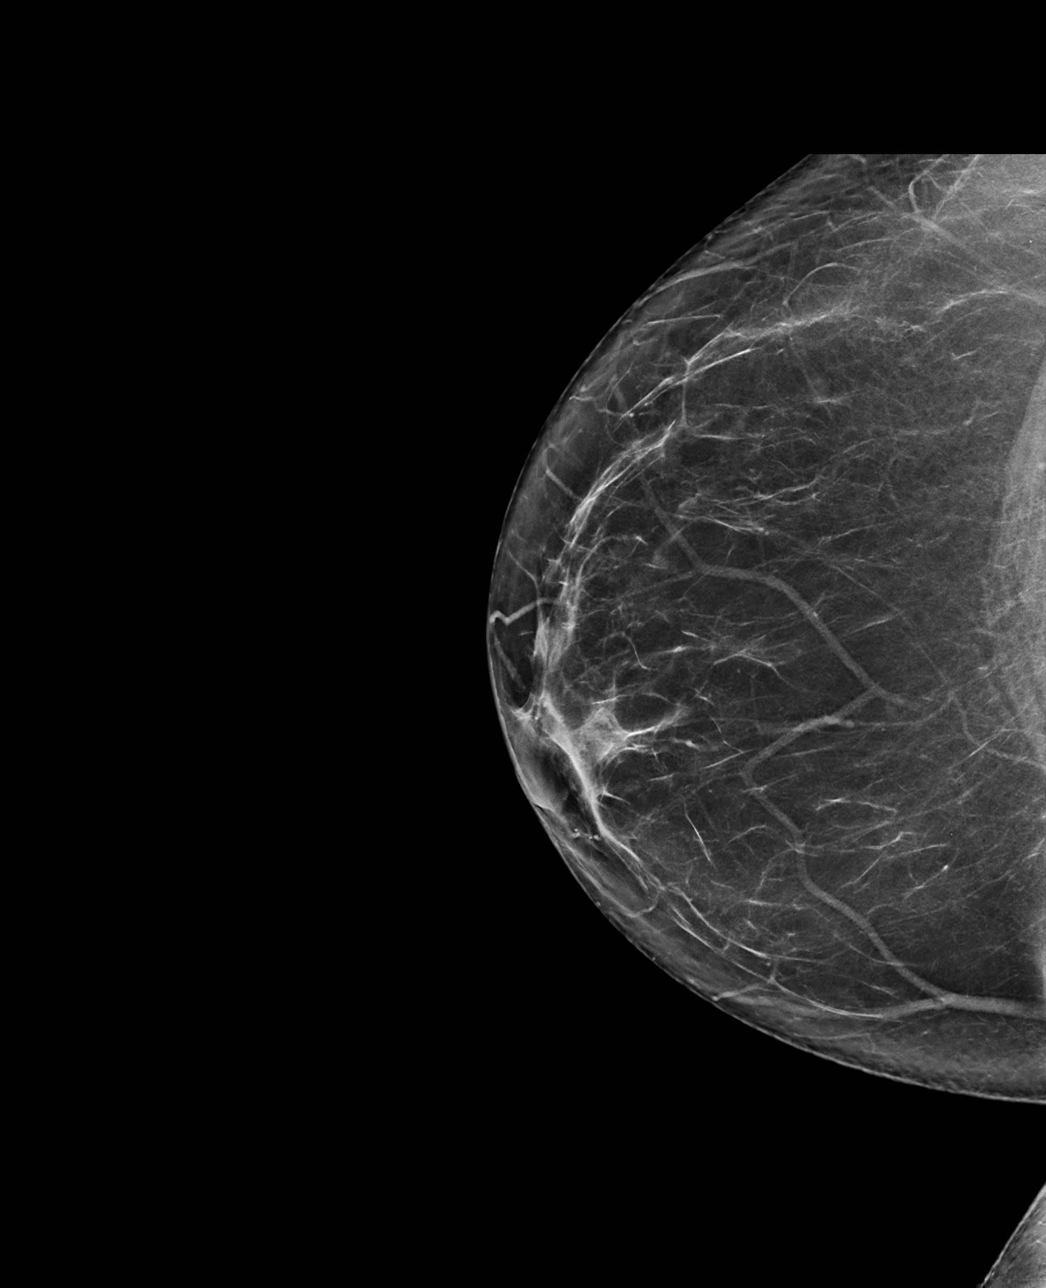

[R MLO tomo · tomo slice 43/86.0]
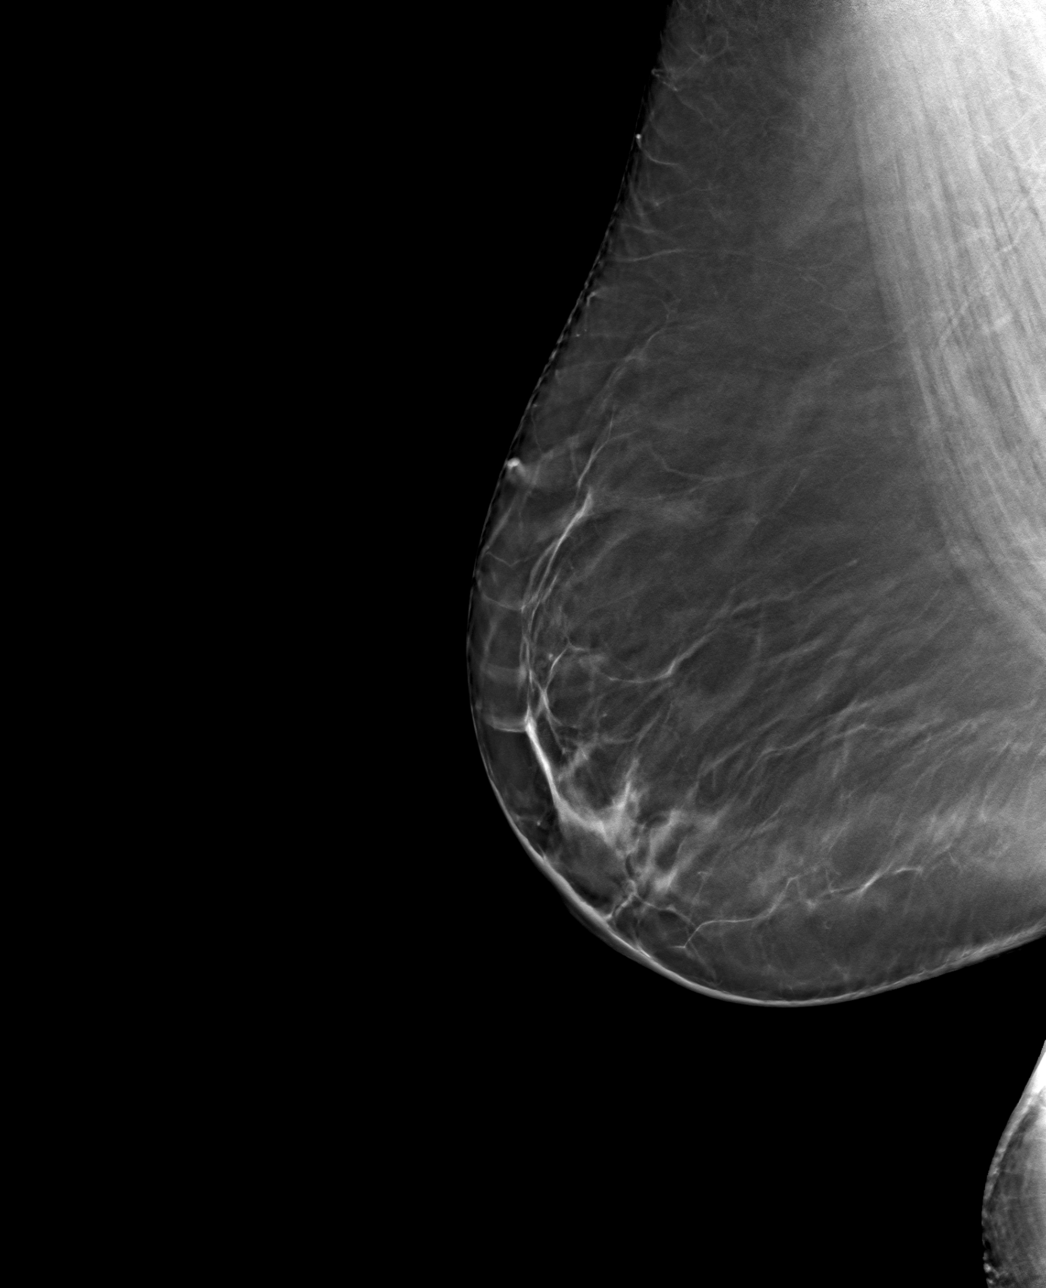

[R CC tomo · tomo slice 39/77.0]
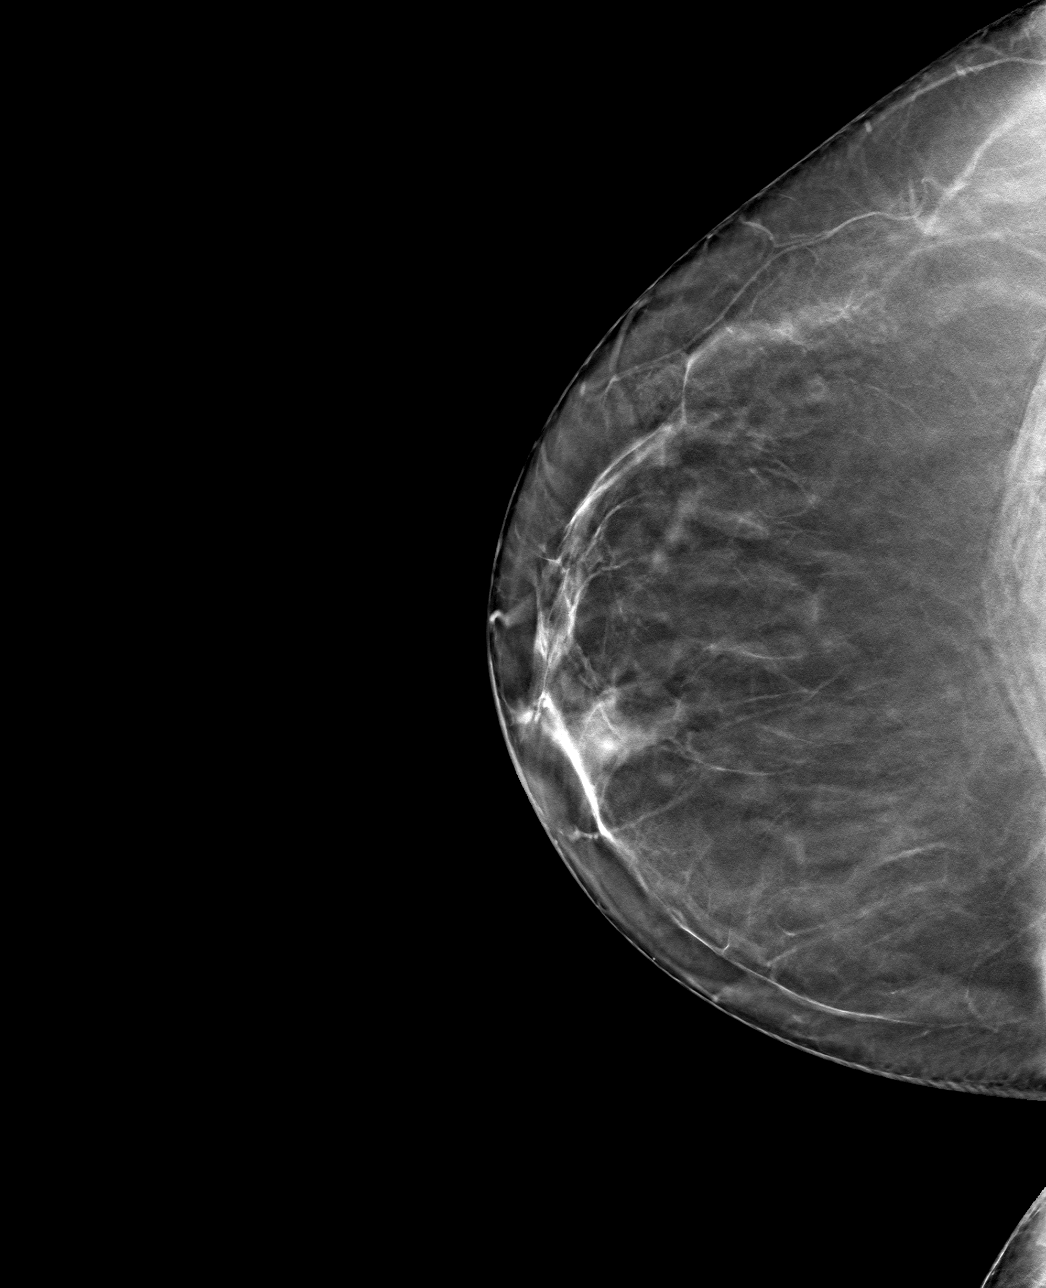

[4 of 12 positions shown; findings below may reference images not displayed]

ACR Breast Density Category b: There are scattered areas of
fibroglandular density.
FINDINGS: The asymmetry in the retroareolar right breast in in the inferior
posterior right breast both resolve with tomosynthesis imaging. No
suspicious calcifications, masses or areas of distortion are seen in
the right breast.

Mammographic images were processed with CAD.
IMPRESSION: Resolution of the 2 right breast asymmetries consistent with
overlapping fibroglandular tissue.

RECOMMENDATION:
Screening mammogram in one year.(Code:[id number]-[id number])

I have discussed the findings and recommendations with the patient.
If applicable, a reminder letter will be sent to the patient
regarding the next appointment.

BI-RADS CATEGORY  1: Negative.

## 2022-06-19 DIAGNOSIS — G4730 Sleep apnea, unspecified: Secondary | ICD-10-CM | POA: Diagnosis not present

## 2022-06-19 DIAGNOSIS — R7303 Prediabetes: Secondary | ICD-10-CM | POA: Diagnosis not present

## 2022-06-19 DIAGNOSIS — Z23 Encounter for immunization: Secondary | ICD-10-CM | POA: Diagnosis not present

## 2022-06-19 DIAGNOSIS — E785 Hyperlipidemia, unspecified: Secondary | ICD-10-CM | POA: Diagnosis not present

## 2022-06-19 DIAGNOSIS — Z0000 Encounter for general adult medical examination without abnormal findings: Secondary | ICD-10-CM | POA: Diagnosis not present

## 2022-08-11 DIAGNOSIS — K31A0 Gastric intestinal metaplasia, unspecified: Secondary | ICD-10-CM | POA: Diagnosis not present

## 2022-08-11 DIAGNOSIS — R1030 Lower abdominal pain, unspecified: Secondary | ICD-10-CM | POA: Diagnosis not present

## 2022-08-11 DIAGNOSIS — K219 Gastro-esophageal reflux disease without esophagitis: Secondary | ICD-10-CM | POA: Diagnosis not present

## 2022-08-13 DIAGNOSIS — K2930 Chronic superficial gastritis without bleeding: Secondary | ICD-10-CM | POA: Diagnosis not present

## 2022-08-13 DIAGNOSIS — K2950 Unspecified chronic gastritis without bleeding: Secondary | ICD-10-CM | POA: Diagnosis not present

## 2022-08-13 DIAGNOSIS — K2940 Chronic atrophic gastritis without bleeding: Secondary | ICD-10-CM | POA: Diagnosis not present

## 2022-08-13 DIAGNOSIS — K31A11 Gastric intestinal metaplasia without dysplasia, involving the antrum: Secondary | ICD-10-CM | POA: Diagnosis not present

## 2022-08-15 ENCOUNTER — Other Ambulatory Visit: Payer: Self-pay | Admitting: Family Medicine

## 2022-08-15 DIAGNOSIS — Z1231 Encounter for screening mammogram for malignant neoplasm of breast: Secondary | ICD-10-CM

## 2022-09-23 ENCOUNTER — Ambulatory Visit
Admission: RE | Admit: 2022-09-23 | Discharge: 2022-09-23 | Disposition: A | Discharge status: Home / Self Care | Payer: BC Managed Care – PPO | Source: Ambulatory Visit | Attending: Family Medicine | Admitting: Family Medicine

## 2022-09-23 DIAGNOSIS — Z1231 Encounter for screening mammogram for malignant neoplasm of breast: Secondary | ICD-10-CM

## 2023-02-11 DIAGNOSIS — G4700 Insomnia, unspecified: Secondary | ICD-10-CM | POA: Diagnosis not present

## 2023-02-11 DIAGNOSIS — R399 Unspecified symptoms and signs involving the genitourinary system: Secondary | ICD-10-CM | POA: Diagnosis not present

## 2023-03-05 DIAGNOSIS — R32 Unspecified urinary incontinence: Secondary | ICD-10-CM | POA: Diagnosis not present

## 2023-03-05 DIAGNOSIS — N390 Urinary tract infection, site not specified: Secondary | ICD-10-CM | POA: Diagnosis not present

## 2023-03-05 DIAGNOSIS — R350 Frequency of micturition: Secondary | ICD-10-CM | POA: Diagnosis not present

## 2023-04-13 DIAGNOSIS — N390 Urinary tract infection, site not specified: Secondary | ICD-10-CM | POA: Diagnosis not present

## 2023-04-13 DIAGNOSIS — R300 Dysuria: Secondary | ICD-10-CM | POA: Diagnosis not present

## 2023-04-20 DIAGNOSIS — R300 Dysuria: Secondary | ICD-10-CM | POA: Diagnosis not present

## 2023-04-20 DIAGNOSIS — R310 Gross hematuria: Secondary | ICD-10-CM | POA: Diagnosis not present

## 2023-04-20 DIAGNOSIS — N952 Postmenopausal atrophic vaginitis: Secondary | ICD-10-CM | POA: Diagnosis not present

## 2023-04-20 DIAGNOSIS — N3946 Mixed incontinence: Secondary | ICD-10-CM | POA: Diagnosis not present

## 2023-04-27 DIAGNOSIS — K760 Fatty (change of) liver, not elsewhere classified: Secondary | ICD-10-CM | POA: Diagnosis not present

## 2023-04-27 DIAGNOSIS — N289 Disorder of kidney and ureter, unspecified: Secondary | ICD-10-CM | POA: Diagnosis not present

## 2023-04-27 DIAGNOSIS — D259 Leiomyoma of uterus, unspecified: Secondary | ICD-10-CM | POA: Diagnosis not present

## 2023-04-27 DIAGNOSIS — R310 Gross hematuria: Secondary | ICD-10-CM | POA: Diagnosis not present

## 2023-04-27 DIAGNOSIS — K8020 Calculus of gallbladder without cholecystitis without obstruction: Secondary | ICD-10-CM | POA: Diagnosis not present

## 2023-05-06 DIAGNOSIS — R310 Gross hematuria: Secondary | ICD-10-CM | POA: Diagnosis not present

## 2023-07-13 DIAGNOSIS — Z0000 Encounter for general adult medical examination without abnormal findings: Secondary | ICD-10-CM | POA: Diagnosis not present

## 2023-07-13 DIAGNOSIS — R7303 Prediabetes: Secondary | ICD-10-CM | POA: Diagnosis not present

## 2023-07-13 DIAGNOSIS — Z23 Encounter for immunization: Secondary | ICD-10-CM | POA: Diagnosis not present

## 2023-07-13 DIAGNOSIS — E785 Hyperlipidemia, unspecified: Secondary | ICD-10-CM | POA: Diagnosis not present

## 2023-09-25 DIAGNOSIS — R399 Unspecified symptoms and signs involving the genitourinary system: Secondary | ICD-10-CM | POA: Diagnosis not present

## 2023-09-25 DIAGNOSIS — R3129 Other microscopic hematuria: Secondary | ICD-10-CM | POA: Diagnosis not present

## 2023-10-11 DIAGNOSIS — B349 Viral infection, unspecified: Secondary | ICD-10-CM | POA: Diagnosis not present

## 2023-10-11 DIAGNOSIS — R051 Acute cough: Secondary | ICD-10-CM | POA: Diagnosis not present

## 2024-08-01 DIAGNOSIS — E785 Hyperlipidemia, unspecified: Secondary | ICD-10-CM | POA: Diagnosis not present

## 2024-08-01 DIAGNOSIS — Z23 Encounter for immunization: Secondary | ICD-10-CM | POA: Diagnosis not present

## 2024-08-01 DIAGNOSIS — G4730 Sleep apnea, unspecified: Secondary | ICD-10-CM | POA: Diagnosis not present

## 2024-08-01 DIAGNOSIS — Z0000 Encounter for general adult medical examination without abnormal findings: Secondary | ICD-10-CM | POA: Diagnosis not present

## 2024-08-01 DIAGNOSIS — R7303 Prediabetes: Secondary | ICD-10-CM | POA: Diagnosis not present

## 2024-08-10 ENCOUNTER — Other Ambulatory Visit: Payer: Self-pay | Admitting: Family Medicine

## 2024-08-10 DIAGNOSIS — Z1231 Encounter for screening mammogram for malignant neoplasm of breast: Secondary | ICD-10-CM

## 2024-09-26 ENCOUNTER — Inpatient Hospital Stay: Admission: RE | Admit: 2024-09-26 | Discharge: 2024-09-26 | Attending: Family Medicine | Admitting: Family Medicine

## 2024-09-26 DIAGNOSIS — Z1231 Encounter for screening mammogram for malignant neoplasm of breast: Secondary | ICD-10-CM
